# Patient Record
Sex: Female | Born: 1950 | Race: White | Hispanic: No | Marital: Married | Smoking: Never smoker
Health system: Southern US, Community
[De-identification: ages and names within clinical notes are randomized; demographics above are authoritative.]

## PROBLEM LIST (undated history)

## (undated) DIAGNOSIS — C9210 Chronic myeloid leukemia, BCR/ABL-positive, not having achieved remission: Secondary | ICD-10-CM

## (undated) DIAGNOSIS — C801 Malignant (primary) neoplasm, unspecified: Secondary | ICD-10-CM

---

## 2001-01-18 HISTORY — PX: THYROIDECTOMY: SHX17

## 2017-12-26 ENCOUNTER — Encounter: Payer: Self-pay | Admitting: Emergency Medicine

## 2017-12-26 ENCOUNTER — Emergency Department
Admission: EM | Admit: 2017-12-26 | Discharge: 2017-12-26 | Disposition: A | Discharge status: Home / Self Care | Payer: Medicare HMO | Attending: Emergency Medicine | Admitting: Emergency Medicine

## 2017-12-26 ENCOUNTER — Other Ambulatory Visit: Payer: Self-pay

## 2017-12-26 ENCOUNTER — Emergency Department: Payer: Medicare HMO

## 2017-12-26 DIAGNOSIS — R079 Chest pain, unspecified: Secondary | ICD-10-CM | POA: Diagnosis present

## 2017-12-26 DIAGNOSIS — E876 Hypokalemia: Secondary | ICD-10-CM | POA: Diagnosis not present

## 2017-12-26 DIAGNOSIS — C9210 Chronic myeloid leukemia, BCR/ABL-positive, not having achieved remission: Secondary | ICD-10-CM | POA: Diagnosis not present

## 2017-12-26 DIAGNOSIS — R0789 Other chest pain: Secondary | ICD-10-CM

## 2017-12-26 HISTORY — DX: Chronic myeloid leukemia, BCR/ABL-positive, not having achieved remission: C92.10

## 2017-12-26 HISTORY — DX: Malignant (primary) neoplasm, unspecified: C80.1

## 2017-12-26 LAB — CBC
HCT: 38.3 % (ref 36.0–46.0)
Hemoglobin: 12.6 g/dL (ref 12.0–15.0)
MCH: 31 pg (ref 26.0–34.0)
MCHC: 32.9 g/dL (ref 30.0–36.0)
MCV: 94.1 fL (ref 80.0–100.0)
NRBC: 0 % (ref 0.0–0.2)
Platelets: 293 10*3/uL (ref 150–400)
RBC: 4.07 MIL/uL (ref 3.87–5.11)
RDW: 13.8 % (ref 11.5–15.5)
WBC: 4.9 10*3/uL (ref 4.0–10.5)

## 2017-12-26 LAB — BASIC METABOLIC PANEL
Anion gap: 8 (ref 5–15)
BUN: 14 mg/dL (ref 8–23)
CHLORIDE: 106 mmol/L (ref 98–111)
CO2: 24 mmol/L (ref 22–32)
Calcium: 8.9 mg/dL (ref 8.9–10.3)
Creatinine, Ser: 0.72 mg/dL (ref 0.44–1.00)
GFR calc non Af Amer: >60 mL/min (ref >60)
Glucose, Bld: 139 mg/dL — ABNORMAL HIGH (ref 70–99)
Potassium: 3.3 mmol/L — ABNORMAL LOW (ref 3.5–5.1)
Sodium: 138 mmol/L (ref 135–145)

## 2017-12-26 LAB — TROPONIN I
Troponin I: <0.03 ng/mL (ref <0.03)
Troponin I: <0.03 ng/mL (ref <0.03)

## 2017-12-26 MED ORDER — POTASSIUM CHLORIDE CRYS ER 20 MEQ PO TBCR
40.0000 meq | EXTENDED_RELEASE_TABLET | Freq: Once | ORAL | Status: AC
  Administered 2017-12-26: 40 meq via ORAL
  Filled 2017-12-26: qty 2

## 2017-12-26 NOTE — ED Triage Notes (Signed)
Brought by ems for chest pain.

## 2017-12-26 NOTE — Discharge Instructions (Signed)
Please have your primary care physician reevaluate you for your chest discomfort, and to recheck your thyroid function.  Your potassium today was slightly low, so please have this rechecked as well.  Return to the emergency department if you develop severe pain, lightheadedness or fainting, sweaty feeling, clammy feeling, palpitations, shortness of breath, or any other symptoms concerning to you.

## 2017-12-26 NOTE — ED Provider Notes (Addendum)
[facility] Emergency Department Provider Note  ____________________________________________  Time seen: Approximately 5:31 PM  I have reviewed the triage vital signs and the nursing notes.   HISTORY  Chief Complaint Chest Pain    HPI Lynn Turner is a 67 y.o. female a remote history of thyroid CA status post thyroidectomy presenting for "surge" sensation in the upper chest.  The patient reports that 5 years ago, she began to experience a "surge" sensation in the upper chest radiating to the shoulders and the upper arms.  This would generally last for 3 to 4 minutes and resolve spontaneously.  She occasionally has shortness of breath with this.  Initially, this happened infrequently, 2-3 times yearly.  Today, the patient has had the same sensation, but has had 4 separate episodes.  The episodes make her feel anxious.  Are unrelated to position or food.  She denies any palpitations, diaphoresis, nausea or vomiting.  She has not had any recent changes in her thyroid medication and says her thyroid function tests were checked in July and were normal.  She has not had a stress test or cardiac catheterization in the past.  At this time, the patient is completely asymptomatic.  Past Medical History:  Diagnosis Date  . Cancer ([place])   . 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   Past Surgical History:  Procedure Laterality Date  . THYROIDECTOMY  2003      Allergies Crab (diagnostic)  No family history on file.  Social History Social History   Tobacco Use  . Smoking status: Never Smoker  . Smokeless tobacco: Never Used  Substance Use Topics  . Alcohol use: Not Currently  . Drug use: Not on file    Review of Systems Constitutional: No fever/chills.  No lightheadedness or syncope.  No diaphoresis. Eyes: No visual changes. ENT: No sore throat. No congestion or rhinorrhea. Cardiovascular:  Denies chest pain.  Positive "surge" sensation in the upper chest.  Denies palpitations. Respiratory: Positive shortness of breath.  No cough. Gastrointestinal: No abdominal pain.  No nausea, no vomiting.  No diarrhea.  No constipation. Genitourinary: Negative for dysuria. Musculoskeletal: Negative for back pain.  No lower extremity swelling or calf pain. Skin: Negative for rash. Neurological: Negative for headaches. No focal numbness, tingling or weakness.  Psychiatric:Anxious.    ____________________________________________   PHYSICAL EXAM:  VITAL SIGNS: ED Triage Vitals  Enc Vitals Group     BP 12/26/17 1445 (!) 170/77     Pulse Rate 12/26/17 1445 79     Resp 12/26/17 1445 16     Temp 12/26/17 1445 98.7 F (37.1 C)     Temp Source 12/26/17 1445 Oral     SpO2 12/26/17 1445 100 %     Weight 12/26/17 1447 173 lb (78.5 kg)     Height 12/26/17 1447 5\' 1"  (1.549 m)     Head Circumference --      Peak Flow --      Pain Score 12/26/17 1447 0     Pain Loc --      Pain Edu? --      Excl. in [place]? --     Constitutional: Alert and oriented. Answers questions appropriately. Eyes: Conjunctivae are normal.  EOMI. No scleral icterus. Head: Atraumatic. Nose: No congestion/rhinnorhea. Mouth/Throat: Mucous membranes are moist.  Neck: No stridor.  Supple.  No JVD.  No meningismus. Cardiovascular: Normal rate, regular rhythm. No murmurs,  rubs or gallops.  Respiratory: Normal respiratory effort.  No accessory muscle use or retractions. Lungs CTAB.  No wheezes, rales or ronchi. Gastrointestinal: Soft, nontender and nondistended.  No guarding or rebound.  No peritoneal signs. Musculoskeletal: No LE edema. No ttp in the calves or palpable cords.  Negative Homan's sign. Neurologic:  A&Ox3.  Speech is clear.  Face and smile are symmetric.  EOMI.  Moves all extremities well. Skin:  Skin is warm, dry and intact. No rash noted. Psychiatric: Mood and affect are normal.   ____________________________________________   LABS (all labs ordered are listed, but only abnormal results are displayed)  Labs Reviewed  BASIC METABOLIC PANEL - Abnormal; Notable for the following components:      Result Value   Potassium 3.3 (*)    Glucose, Bld 139 (*)    All other components within normal limits  CBC  TROPONIN I  TROPONIN I   ____________________________________________  EKG  ED ECG REPORT I, Anne-Caroline Mariea Clonts, the attending physician, personally viewed and interpreted this ECG.   Date: 12/26/2017  EKG Time: 1456  Rate: 80  Rhythm: normal sinus rhythm  Axis: normal  Intervals:none  ST&T Change: No STEMI : Nonspecific T wave inversion in V1.  ____________________________________________  RADIOLOGY  Dg Chest 2 View  Result Date: 12/26/2017 CLINICAL DATA:  Onset chest pain and weakness today. EXAM: CHEST - 2 VIEW COMPARISON:  None. FINDINGS: Calcified granuloma left lower lobe noted. Lungs otherwise clear. Heart size is normal. No pneumothorax or pleural fluid. No acute or focal bony abnormality. IMPRESSION: No acute disease. Electronically Signed   By: Inge Rise M.D.   On: 12/26/2017 16:01    ____________________________________________   PROCEDURES  Procedure(s) performed: None  Procedures  Critical Care performed: No ____________________________________________   INITIAL IMPRESSION / ASSESSMENT AND PLAN / ED COURSE  Pertinent labs & imaging results that were available during my care of the patient were reviewed by me and considered in my medical decision making (see chart for details).  67 y.o. female with a history of thyroidectomy presenting with a "surge sensation in the upper chest radiating to the arms bilaterally associated with shortness of breath.  Overall, the patient is well-appearing and is asymptomatic at this time.  Her chest pain episodes are atypical for ACS or MI.  I favor other possible etiologies.  I have  offered to check the patient's TSH, but she states she prefers to do this with her PMD.  Here, she is hemodynamically stable without any ischemic changes on her EKG.  Her chest x-ray does not show any acute disease.  Her troponin is negative and a second troponin is pending.  She is not anemic and her electrolytes are reassuring.  Her potassium is 3.3 and I will give her supplementation.  If the patient second troponin is negative, we will plan to discharge the patient home with close PMD follow-up.  I have encouraged her to have her PMD schedule her for an outpatient stress test and to follow-up her thyroid function.  Today, I do not suspect a PE or aortic pathology.  ____________________________________________  FINAL CLINICAL IMPRESSION(S) / ED DIAGNOSES  Final diagnoses:  Chest discomfort  Hypokalemia         NEW MEDICATIONS STARTED DURING THIS VISIT:  New Prescriptions   No medications on file      Eula Listen, MD 12/26/17 1738    Eula Listen, MD 12/26/17 1739

## 2017-12-26 NOTE — ED Triage Notes (Signed)
Says she started having chst pain that radiates out to shoulders, back and makes her feel weak.  satred about 125pm today and then 2 more times.  Says this happens, but not so often.

## 2020-01-01 IMAGING — CR DG CHEST 2V
2 series · 2 of 2 positions shown · non-contrast
Comparison: None.

CLINICAL DATA: Onset chest pain and weakness today.

EXAM:
CHEST - 2 VIEW

[chest pa]
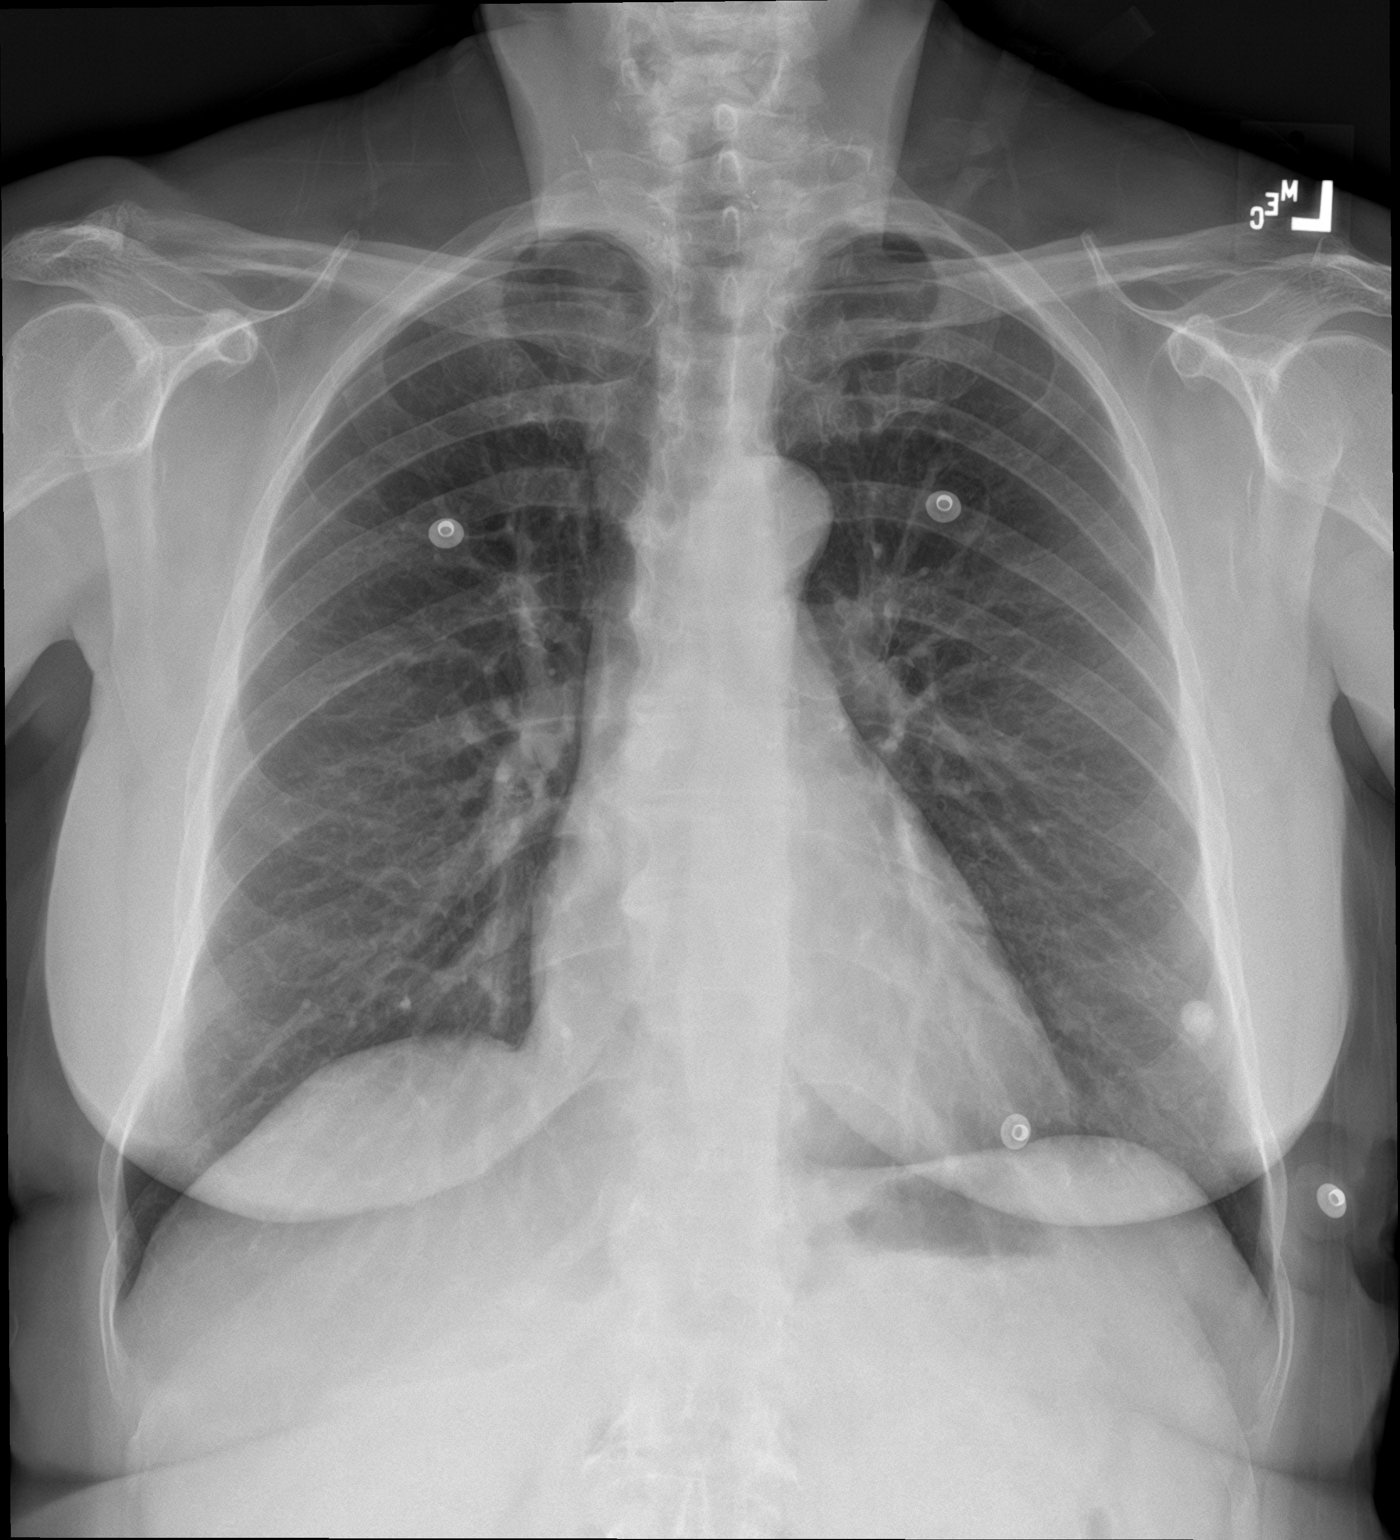

[chest lat]
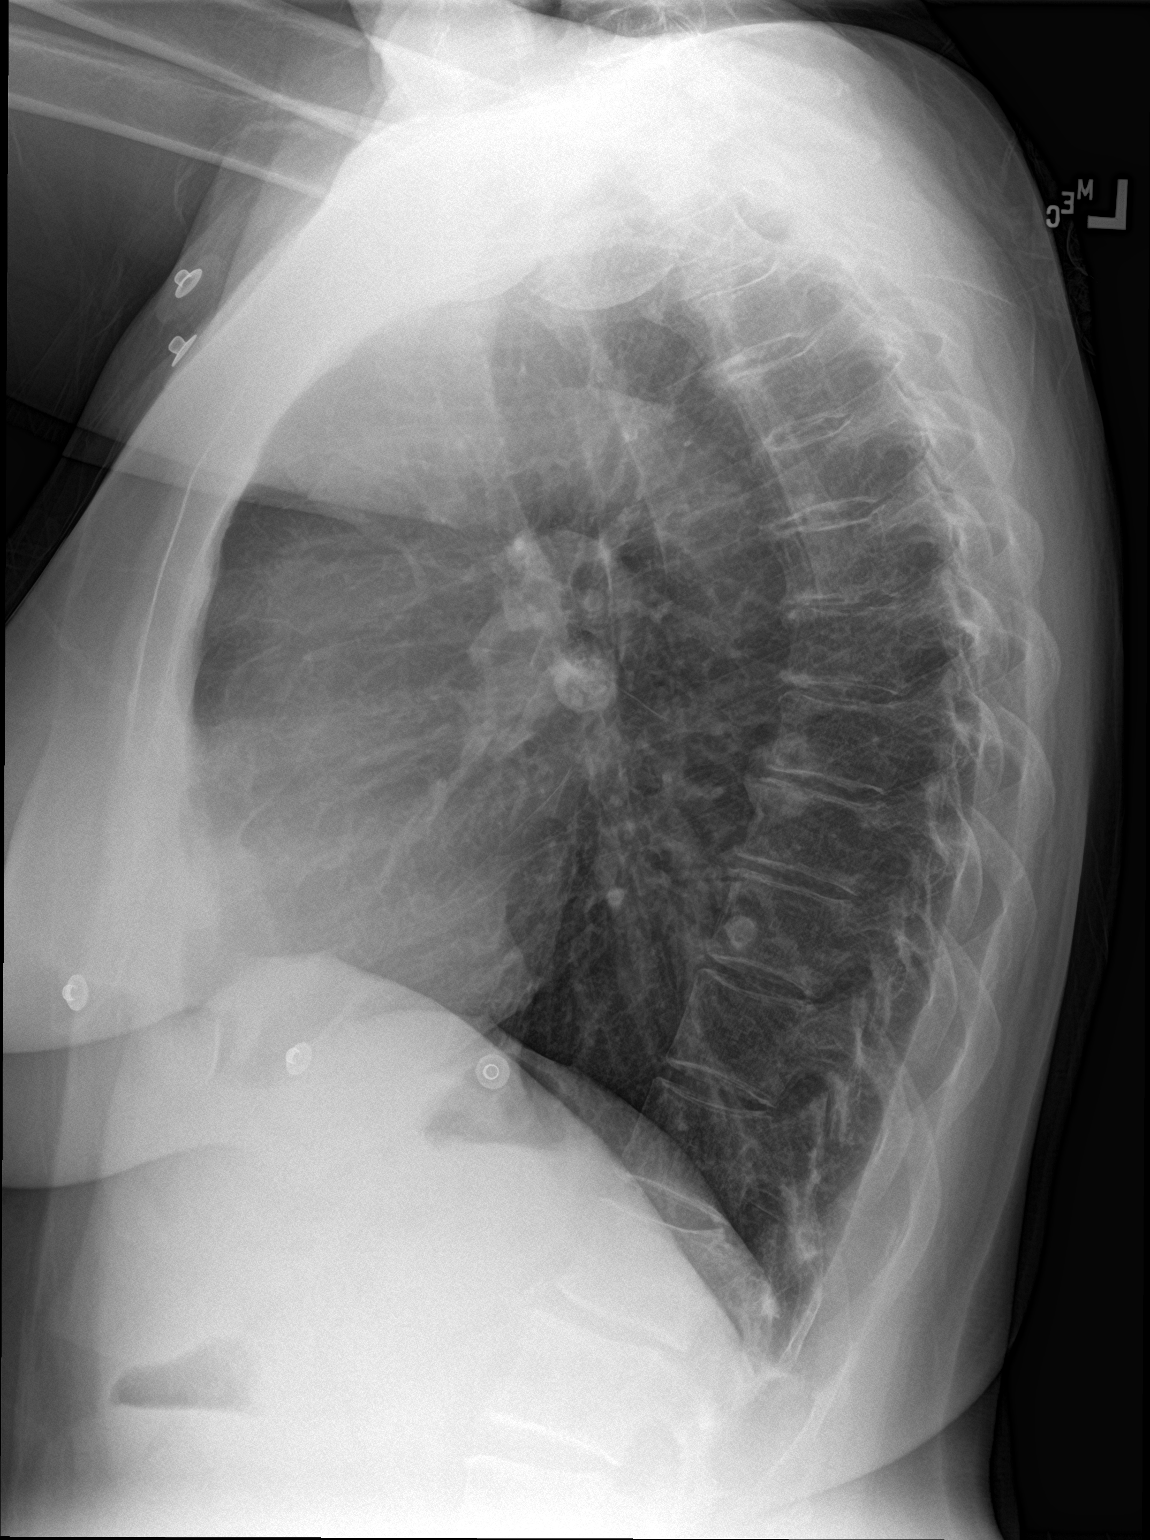

[2 of 2 positions shown; findings below may reference images not displayed]

FINDINGS: Calcified granuloma left lower lobe noted. Lungs otherwise clear.
Heart size is normal. No pneumothorax or pleural fluid. No acute or
focal bony abnormality.
IMPRESSION: No acute disease.
